# Patient Record
Sex: Female | Born: 1985 | Race: White | Hispanic: No | Marital: Single | Smoking: Current every day smoker
Health system: Southern US, Community
[De-identification: ages and names within clinical notes are randomized; demographics above are authoritative.]

## PROBLEM LIST (undated history)

## (undated) DIAGNOSIS — M009 Pyogenic arthritis, unspecified: Secondary | ICD-10-CM

## (undated) HISTORY — PX: THYROGLOSSAL DUCT CYST: SHX297

---

## 2009-12-30 ENCOUNTER — Emergency Department: Payer: Self-pay | Admitting: Emergency Medicine

## 2011-03-17 ENCOUNTER — Emergency Department: Payer: Self-pay | Admitting: Emergency Medicine

## 2011-04-17 ENCOUNTER — Emergency Department: Payer: Self-pay | Admitting: Emergency Medicine

## 2013-06-14 ENCOUNTER — Emergency Department: Payer: Self-pay | Admitting: Emergency Medicine

## 2013-08-04 ENCOUNTER — Emergency Department: Payer: Self-pay | Admitting: Emergency Medicine

## 2013-08-04 LAB — URINALYSIS, COMPLETE
Bacteria: NONE SEEN
Blood: NEGATIVE
Ketone: NEGATIVE
Leukocyte Esterase: NEGATIVE
Ph: 6 (ref 4.5–8.0)
RBC,UR: 2 /HPF (ref 0–5)
Squamous Epithelial: 2

## 2013-08-04 LAB — CBC
HCT: 42.4 % (ref 35.0–47.0)
HGB: 14.6 g/dL (ref 12.0–16.0)
MCHC: 34.5 g/dL (ref 32.0–36.0)
MCV: 85 fL (ref 80–100)
RBC: 5.01 10*6/uL (ref 3.80–5.20)
WBC: 10.2 10*3/uL (ref 3.6–11.0)

## 2013-08-04 LAB — BASIC METABOLIC PANEL
Calcium, Total: 9.2 mg/dL (ref 8.5–10.1)
Chloride: 104 mmol/L (ref 98–107)
Co2: 27 mmol/L (ref 21–32)
EGFR (African American): >60
EGFR (Non-African Amer.): >60
Glucose: 106 mg/dL — ABNORMAL HIGH (ref 65–99)
Osmolality: 275 (ref 275–301)
Potassium: 3.5 mmol/L (ref 3.5–5.1)
Sodium: 138 mmol/L (ref 136–145)

## 2013-08-08 LAB — WOUND CULTURE

## 2013-08-27 ENCOUNTER — Emergency Department: Payer: Self-pay | Admitting: Emergency Medicine

## 2013-08-27 LAB — COMPREHENSIVE METABOLIC PANEL
ALBUMIN: 3.8 g/dL (ref 3.4–5.0)
ANION GAP: 6 — AB (ref 7–16)
AST: 21 U/L (ref 15–37)
Alkaline Phosphatase: 58 U/L
BUN: 9 mg/dL (ref 7–18)
Bilirubin,Total: 0.6 mg/dL (ref 0.2–1.0)
CO2: 24 mmol/L (ref 21–32)
Calcium, Total: 9.1 mg/dL (ref 8.5–10.1)
Chloride: 103 mmol/L (ref 98–107)
Creatinine: 0.63 mg/dL (ref 0.60–1.30)
EGFR (African American): >60
GLUCOSE: 134 mg/dL — AB (ref 65–99)
OSMOLALITY: 267 (ref 275–301)
Potassium: 3.4 mmol/L — ABNORMAL LOW (ref 3.5–5.1)
SGPT (ALT): 19 U/L (ref 12–78)
Sodium: 133 mmol/L — ABNORMAL LOW (ref 136–145)
Total Protein: 7.3 g/dL (ref 6.4–8.2)

## 2013-08-27 LAB — URINALYSIS, COMPLETE
BACTERIA: NONE SEEN
BILIRUBIN, UR: NEGATIVE
BLOOD: NEGATIVE
GLUCOSE, UR: NEGATIVE mg/dL (ref 0–75)
Nitrite: NEGATIVE
Ph: 8 (ref 4.5–8.0)
Protein: 100
Specific Gravity: 1.027 (ref 1.003–1.030)
Squamous Epithelial: 1

## 2013-08-27 LAB — CBC WITH DIFFERENTIAL/PLATELET
BASOS ABS: 0 10*3/uL (ref 0.0–0.1)
Basophil %: 0.6 %
EOS ABS: 0 10*3/uL (ref 0.0–0.7)
EOS PCT: 0.1 %
HCT: 42.3 % (ref 35.0–47.0)
HGB: 14.5 g/dL (ref 12.0–16.0)
LYMPHS ABS: 0.9 10*3/uL — AB (ref 1.0–3.6)
Lymphocyte %: 15.6 %
MCH: 29.2 pg (ref 26.0–34.0)
MCHC: 34.3 g/dL (ref 32.0–36.0)
MCV: 85 fL (ref 80–100)
Monocyte #: 0.2 x10 3/mm (ref 0.2–0.9)
Monocyte %: 3.6 %
NEUTROS PCT: 80.1 %
Neutrophil #: 4.7 10*3/uL (ref 1.4–6.5)
PLATELETS: 145 10*3/uL — AB (ref 150–440)
RBC: 4.96 10*6/uL (ref 3.80–5.20)
RDW: 13.5 % (ref 11.5–14.5)
WBC: 5.8 10*3/uL (ref 3.6–11.0)

## 2013-08-27 LAB — LIPASE, BLOOD: Lipase: 93 U/L (ref 73–393)

## 2013-08-29 ENCOUNTER — Emergency Department: Payer: Self-pay | Admitting: Emergency Medicine

## 2013-08-29 LAB — URINALYSIS, COMPLETE
BILIRUBIN, UR: NEGATIVE
Bacteria: NONE SEEN
Glucose,UR: NEGATIVE mg/dL (ref 0–75)
Ketone: NEGATIVE
Nitrite: NEGATIVE
PH: 6 (ref 4.5–8.0)
Protein: NEGATIVE
RBC,UR: 3 /HPF (ref 0–5)
Specific Gravity: 1.027 (ref 1.003–1.030)
Squamous Epithelial: 2
WBC UR: 4 /HPF (ref 0–5)

## 2013-08-29 LAB — COMPREHENSIVE METABOLIC PANEL
Albumin: 4.2 g/dL (ref 3.4–5.0)
Alkaline Phosphatase: 59 U/L
Anion Gap: 2 — ABNORMAL LOW (ref 7–16)
BILIRUBIN TOTAL: 0.6 mg/dL (ref 0.2–1.0)
BUN: 12 mg/dL (ref 7–18)
CALCIUM: 9.2 mg/dL (ref 8.5–10.1)
CHLORIDE: 107 mmol/L (ref 98–107)
CREATININE: 0.77 mg/dL (ref 0.60–1.30)
Co2: 29 mmol/L (ref 21–32)
Glucose: 98 mg/dL (ref 65–99)
Osmolality: 275 (ref 275–301)
Potassium: 3.7 mmol/L (ref 3.5–5.1)
SGOT(AST): 11 U/L — ABNORMAL LOW (ref 15–37)
SGPT (ALT): 21 U/L (ref 12–78)
Sodium: 138 mmol/L (ref 136–145)
TOTAL PROTEIN: 7.8 g/dL (ref 6.4–8.2)

## 2013-08-29 LAB — CBC WITH DIFFERENTIAL/PLATELET
Basophil #: 0 10*3/uL (ref 0.0–0.1)
Basophil %: 0.8 %
Eosinophil #: 0 10*3/uL (ref 0.0–0.7)
Eosinophil %: 0.6 %
HCT: 43.7 % (ref 35.0–47.0)
HGB: 14.9 g/dL (ref 12.0–16.0)
LYMPHS ABS: 1.3 10*3/uL (ref 1.0–3.6)
Lymphocyte %: 28.3 %
MCH: 29.3 pg (ref 26.0–34.0)
MCHC: 34.1 g/dL (ref 32.0–36.0)
MCV: 86 fL (ref 80–100)
MONO ABS: 0.2 x10 3/mm (ref 0.2–0.9)
Monocyte %: 5.1 %
Neutrophil #: 3.1 10*3/uL (ref 1.4–6.5)
Neutrophil %: 65.2 %
Platelet: 131 10*3/uL — ABNORMAL LOW (ref 150–440)
RBC: 5.08 10*6/uL (ref 3.80–5.20)
RDW: 13.7 % (ref 11.5–14.5)
WBC: 4.8 10*3/uL (ref 3.6–11.0)

## 2013-08-29 LAB — LIPASE, BLOOD: LIPASE: 110 U/L (ref 73–393)

## 2014-07-13 ENCOUNTER — Emergency Department: Payer: Self-pay | Admitting: Emergency Medicine

## 2014-08-14 IMAGING — CT CT ABD-PELV W/ CM
2 of 4 series · 17 of 46 positions shown, 19 images · IV contrast (isovue)
Comparison: CT of the abdomen and pelvis performed 03/17/2011

CLINICAL DATA: Generalized abdominal pain and vomiting.

EXAM:
CT ABDOMEN AND PELVIS WITH CONTRAST
TECHNIQUE: Multidetector CT imaging of the abdomen and pelvis was performed
using the standard protocol following bolus administration of
intravenous contrast.
CONTRAST:  100 mL of Isovue 370 IV contrast

[Series 2: routine abd pel with · axial · 0.72mm/px · z∈[-483,-53]mm · 14 of 94 slices shown, 16 images]
[im 4/94  soft-tissue]
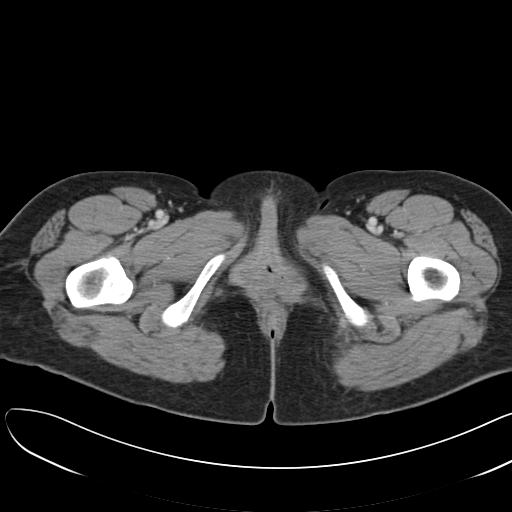
[im 4/94  bone]
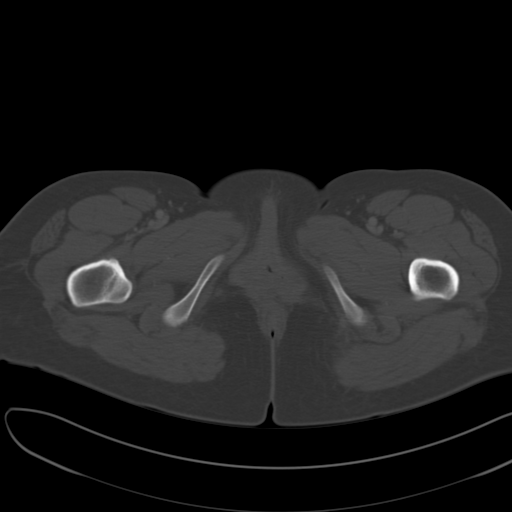
[im 12/94  soft-tissue]
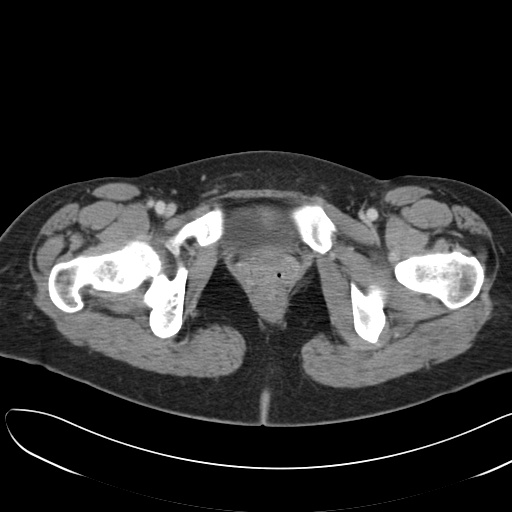
[im 19/94  soft-tissue]
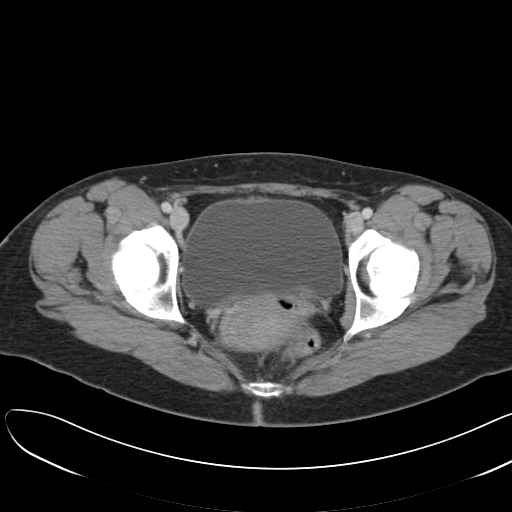
[im 27/94  soft-tissue]
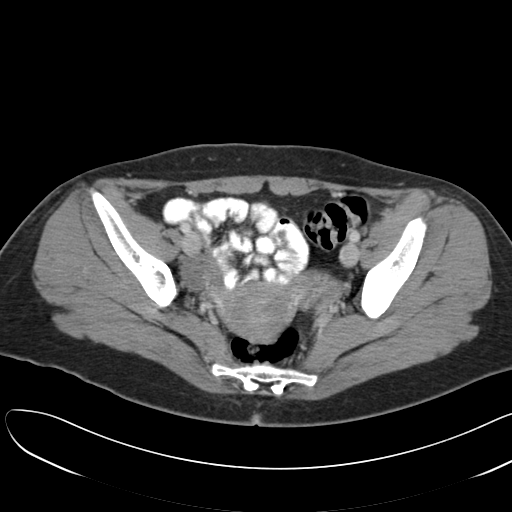
[im 30/94  soft-tissue]
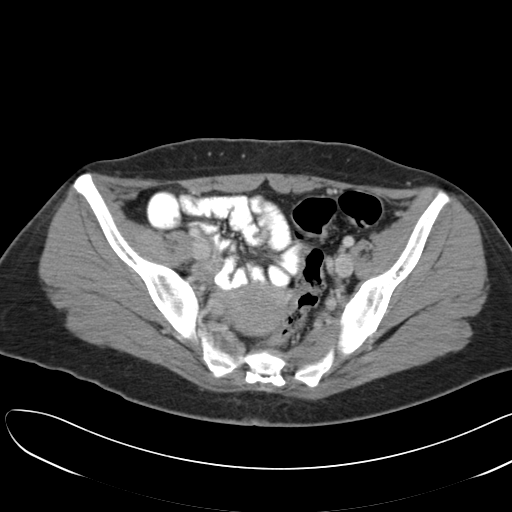
[im 38/94  soft-tissue]
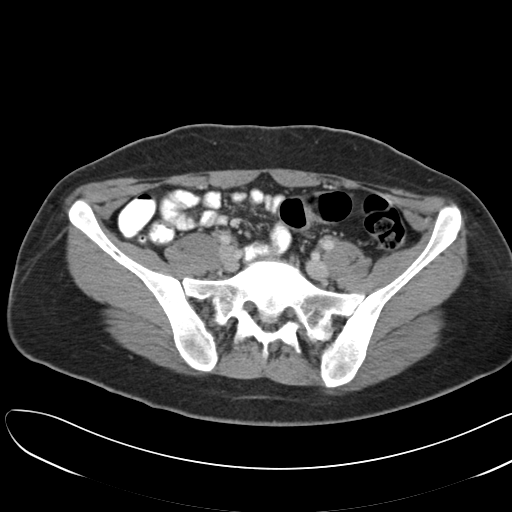
[im 45/94  soft-tissue]
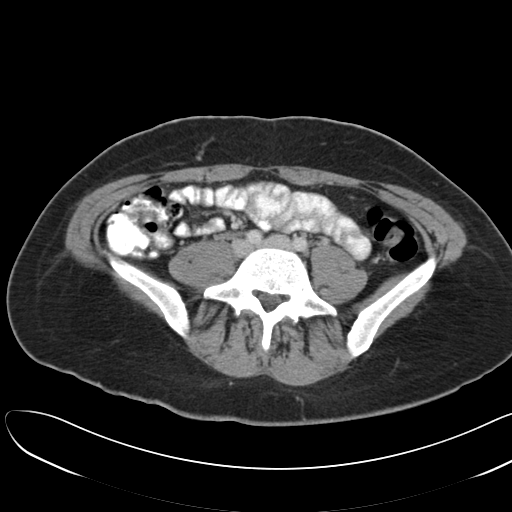
[im 49/94  soft-tissue]
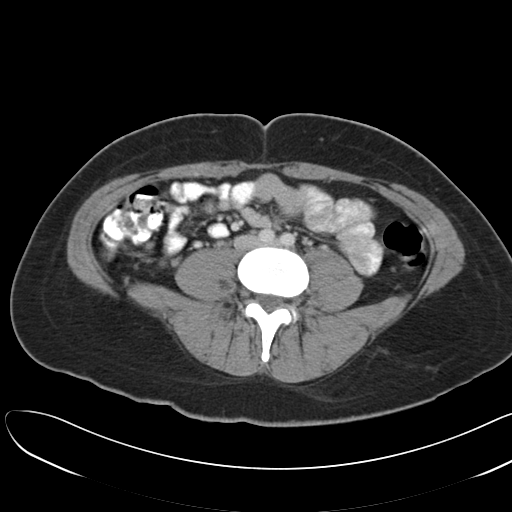
[im 56/94  soft-tissue]
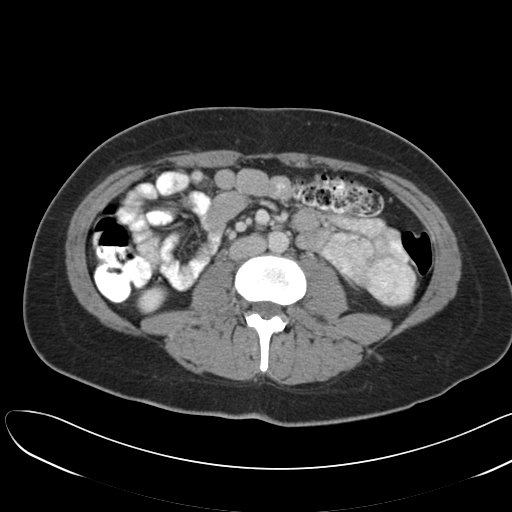
[im 56/94  bone]
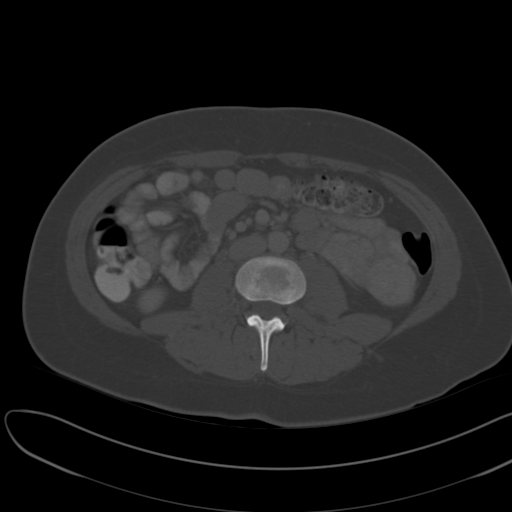
[im 64/94  soft-tissue]
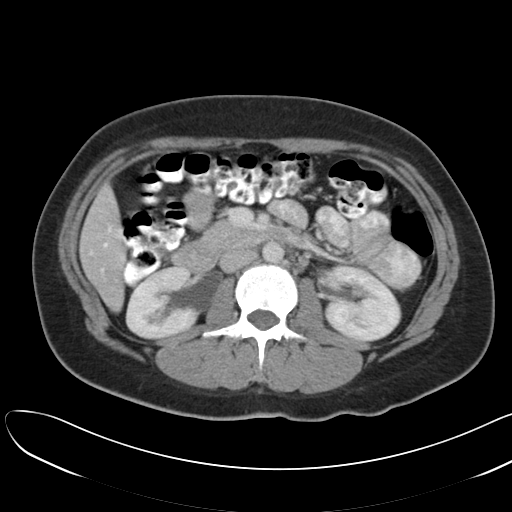
[im 71/94  soft-tissue]
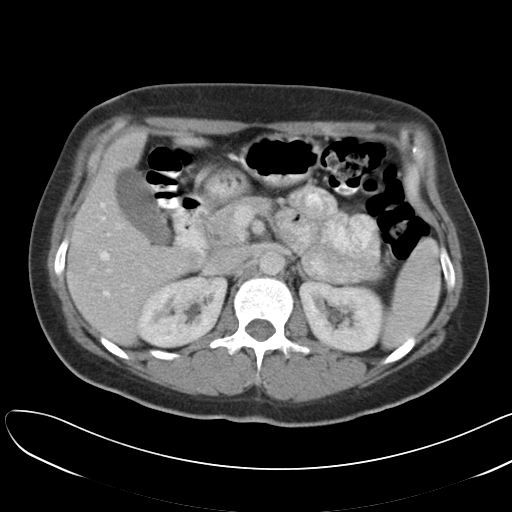
[im 75/94  soft-tissue]
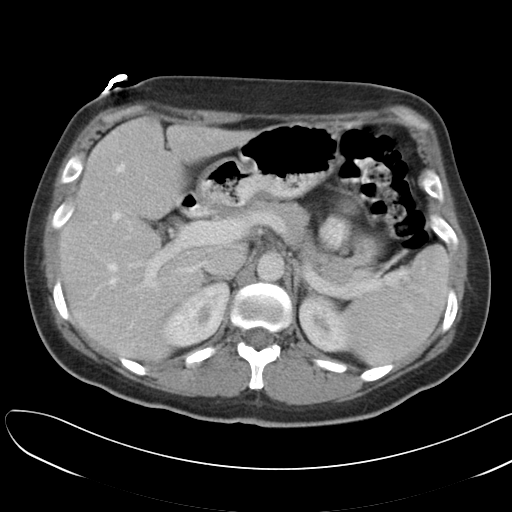
[im 82/94  soft-tissue]
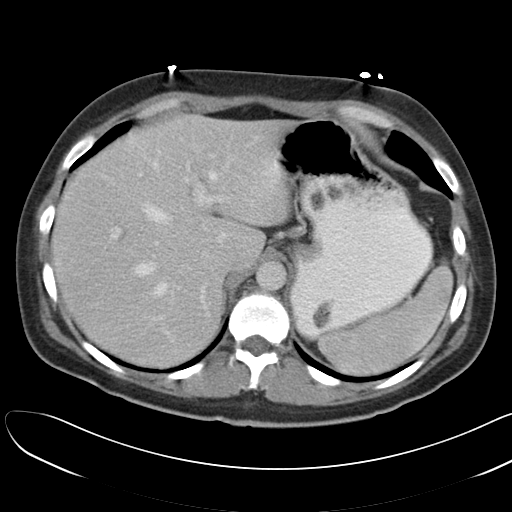
[im 90/94  soft-tissue]
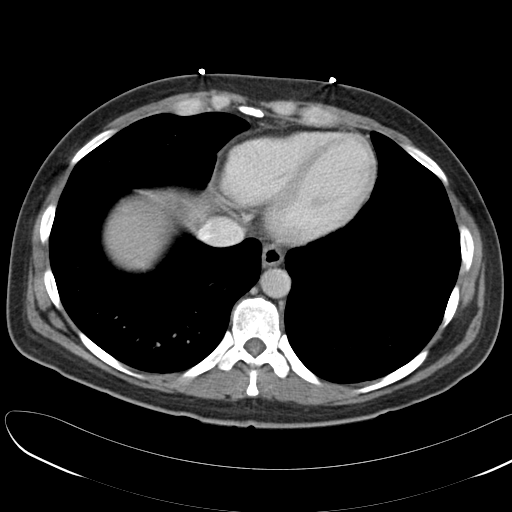

[Series 5: cor routine abd pel with · coronal · 0.94mm/px · 3 of 117 slices shown]
[im 39/117  soft-tissue]
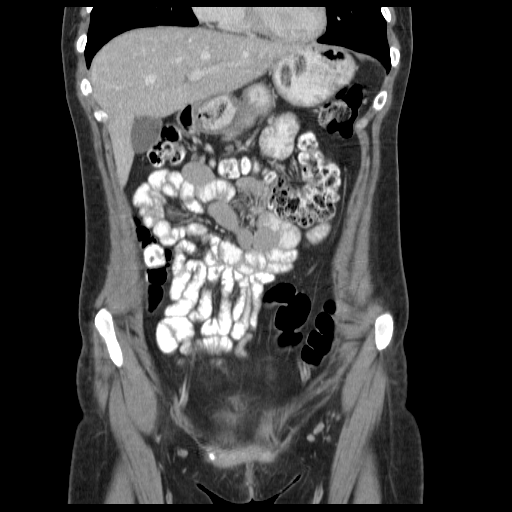
[im 52/117  soft-tissue]
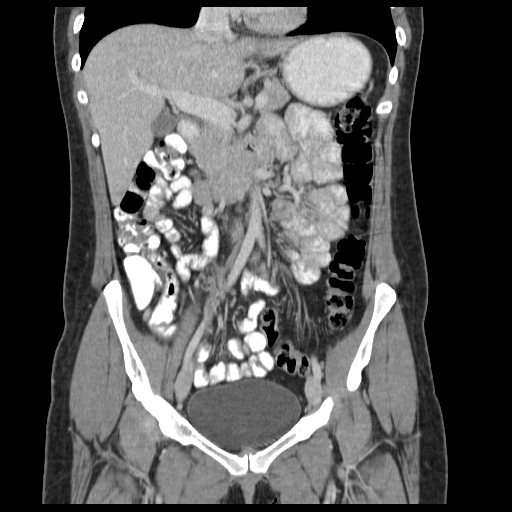
[im 65/117  soft-tissue]
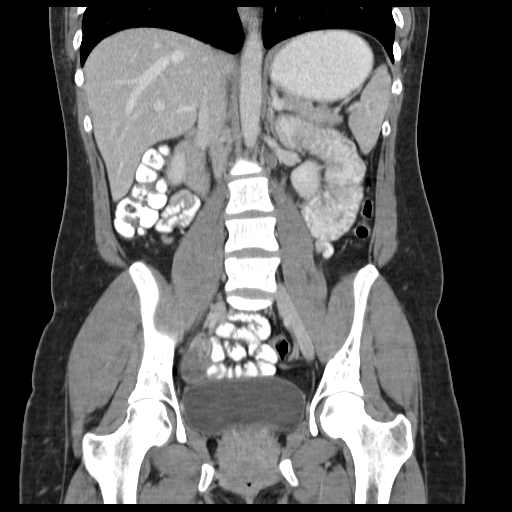

[17 of 46 positions shown; findings below may reference images not displayed]

FINDINGS: The visualized lung bases are clear.

The liver and spleen are unremarkable in appearance. The gallbladder
is within normal limits. The pancreas and adrenal glands are
unremarkable.

The kidneys are unremarkable in appearance. There is no evidence of
hydronephrosis. No renal or ureteral stones are seen. No perinephric
stranding is appreciated.

No free fluid is identified. The small bowel is unremarkable in
appearance. The stomach is largely filled with contrast and is
within normal limits. No acute vascular abnormalities are seen.

The appendix is normal in caliber and contains contrast and air,
without evidence for appendicitis. Contrast progresses to the level
of the splenic flexure of the colon. The colon is unremarkable in
appearance.

The bladder is mildly distended and grossly unremarkable. The uterus
is within normal limits. The ovaries are relatively symmetric. No
suspicious adnexal masses are seen. No inguinal lymphadenopathy is
seen.

No acute osseous abnormalities are identified.
IMPRESSION: No acute abnormality seen within the abdomen or pelvis.

## 2015-03-19 ENCOUNTER — Encounter: Payer: Self-pay | Admitting: Emergency Medicine

## 2015-03-19 ENCOUNTER — Emergency Department
Admission: EM | Admit: 2015-03-19 | Discharge: 2015-03-19 | Discharge status: Against Medical Advice | Payer: Medicaid Other | Attending: Emergency Medicine | Admitting: Emergency Medicine

## 2015-03-19 DIAGNOSIS — R51 Headache: Secondary | ICD-10-CM | POA: Insufficient documentation

## 2015-03-19 DIAGNOSIS — K088 Other specified disorders of teeth and supporting structures: Secondary | ICD-10-CM | POA: Insufficient documentation

## 2015-03-19 DIAGNOSIS — Z720 Tobacco use: Secondary | ICD-10-CM | POA: Diagnosis not present

## 2015-03-19 HISTORY — DX: Pyogenic arthritis, unspecified: M00.9

## 2015-03-19 NOTE — ED Notes (Signed)
Pt presents to ED with right sided mouth / tooth pain for the past several days. Pt states her pain worsened tonight and has now radiated to her jaw, ear, and up into her head. Denies vomiting.

## 2015-09-07 ENCOUNTER — Emergency Department
Admission: EM | Admit: 2015-09-07 | Discharge: 2015-09-07 | Disposition: A | Discharge status: Home / Self Care | Payer: Medicaid Other | Attending: Emergency Medicine | Admitting: Emergency Medicine

## 2015-09-07 DIAGNOSIS — Y9289 Other specified places as the place of occurrence of the external cause: Secondary | ICD-10-CM | POA: Diagnosis not present

## 2015-09-07 DIAGNOSIS — T402X1A Poisoning by other opioids, accidental (unintentional), initial encounter: Secondary | ICD-10-CM | POA: Diagnosis not present

## 2015-09-07 DIAGNOSIS — F17210 Nicotine dependence, cigarettes, uncomplicated: Secondary | ICD-10-CM | POA: Diagnosis not present

## 2015-09-07 DIAGNOSIS — Y9389 Activity, other specified: Secondary | ICD-10-CM | POA: Diagnosis not present

## 2015-09-07 DIAGNOSIS — T40601A Poisoning by unspecified narcotics, accidental (unintentional), initial encounter: Secondary | ICD-10-CM

## 2015-09-07 DIAGNOSIS — Y998 Other external cause status: Secondary | ICD-10-CM | POA: Insufficient documentation

## 2015-09-07 LAB — CBC
HEMATOCRIT: 32.4 % — AB (ref 35.0–47.0)
HEMOGLOBIN: 10.7 g/dL — AB (ref 12.0–16.0)
MCH: 29.2 pg (ref 26.0–34.0)
MCHC: 33.1 g/dL (ref 32.0–36.0)
MCV: 88.3 fL (ref 80.0–100.0)
Platelets: 120 10*3/uL — ABNORMAL LOW (ref 150–440)
RBC: 3.68 MIL/uL — AB (ref 3.80–5.20)
RDW: 13.2 % (ref 11.5–14.5)
WBC: 5.1 10*3/uL (ref 3.6–11.0)

## 2015-09-07 LAB — COMPREHENSIVE METABOLIC PANEL
ALBUMIN: 4.4 g/dL (ref 3.5–5.0)
ALT: 7 U/L — ABNORMAL LOW (ref 14–54)
ANION GAP: 8 (ref 5–15)
AST: 15 U/L (ref 15–41)
Alkaline Phosphatase: 51 U/L (ref 38–126)
BUN: 16 mg/dL (ref 6–20)
CHLORIDE: 105 mmol/L (ref 101–111)
CO2: 24 mmol/L (ref 22–32)
Calcium: 8.8 mg/dL — ABNORMAL LOW (ref 8.9–10.3)
Creatinine, Ser: 0.88 mg/dL (ref 0.44–1.00)
GFR calc non Af Amer: >60 mL/min (ref >60)
GLUCOSE: 91 mg/dL (ref 65–99)
Potassium: 3.5 mmol/L (ref 3.5–5.1)
SODIUM: 137 mmol/L (ref 135–145)
Total Bilirubin: 0.6 mg/dL (ref 0.3–1.2)
Total Protein: 7.6 g/dL (ref 6.5–8.1)

## 2015-09-07 LAB — ACETAMINOPHEN LEVEL

## 2015-09-07 LAB — SALICYLATE LEVEL

## 2015-09-07 LAB — ETHANOL: Alcohol, Ethyl (B): <5 mg/dL (ref <5)
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
## 2015-09-07 NOTE — Discharge Instructions (Signed)
Narcotic Overdose °A narcotic overdose is the misuse or overuse of a narcotic drug. A narcotic overdose can make you pass out and stop breathing. If you are not treated right away, this can cause permanent brain damage or stop your heart. Medicine may be given to reverse the effects of an overdose. If so, this medicine may bring on withdrawal symptoms. The symptoms may be abdominal cramps, throwing up (vomiting), sweating, chills, and nervousness. °Injecting narcotics can cause more problems than just an overdose. AIDS, hepatitis, and other very serious infections are transmitted by sharing needles and syringes. If you decide to quit using, there are medicines which can help you through the withdrawal period. Trying to quit all at once on your own can be uncomfortable, but not life-threatening. Call your caregiver, Narcotics Anonymous, or any drug and alcohol treatment program for further help.  °  °This information is not intended to replace advice given to you by your health care provider. Make sure you discuss any questions you have with your health care provider. °  °Document Released: 08/31/2004 Document Revised: 08/14/2014 Document Reviewed: 01/13/2015 °Elsevier Interactive Patient Education ©2016 [employer]. ° °

## 2015-09-07 NOTE — ED Notes (Signed)
Pt sitting up in wheelchair, in nad.  resp even and unlabored.

## 2015-09-07 NOTE — ED Notes (Signed)
Patient discharged to home per MD order. Patient in stable condition, and deemed medically cleared by ED provider for discharge. Discharge instructions reviewed with patient/family using "Teach Back"; verbalized understanding of medication education and administration, and information about follow-up care. Denies further concerns. ° °
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
## 2015-09-07 NOTE — ED Notes (Signed)
Unable to void at this time.

## 2017-12-12 ENCOUNTER — Emergency Department
Admission: EM | Admit: 2017-12-12 | Discharge: 2017-12-12 | Disposition: A | Discharge status: Home / Self Care | Payer: Self-pay | Attending: Emergency Medicine | Admitting: Emergency Medicine

## 2017-12-12 ENCOUNTER — Encounter: Payer: Self-pay | Admitting: Emergency Medicine

## 2017-12-12 ENCOUNTER — Emergency Department: Payer: Self-pay

## 2017-12-12 DIAGNOSIS — F17210 Nicotine dependence, cigarettes, uncomplicated: Secondary | ICD-10-CM | POA: Insufficient documentation

## 2017-12-12 DIAGNOSIS — L03211 Cellulitis of face: Secondary | ICD-10-CM | POA: Insufficient documentation

## 2017-12-12 DIAGNOSIS — R400 Somnolence: Secondary | ICD-10-CM | POA: Insufficient documentation

## 2017-12-12 DIAGNOSIS — R600 Localized edema: Secondary | ICD-10-CM | POA: Insufficient documentation

## 2017-12-12 DIAGNOSIS — F1190 Opioid use, unspecified, uncomplicated: Secondary | ICD-10-CM | POA: Insufficient documentation

## 2017-12-12 DIAGNOSIS — F1590 Other stimulant use, unspecified, uncomplicated: Secondary | ICD-10-CM | POA: Insufficient documentation

## 2017-12-12 DIAGNOSIS — L299 Pruritus, unspecified: Secondary | ICD-10-CM | POA: Insufficient documentation

## 2017-12-12 LAB — COMPREHENSIVE METABOLIC PANEL
ALT: 12 U/L — ABNORMAL LOW (ref 14–54)
AST: 18 U/L (ref 15–41)
Albumin: 3.1 g/dL — ABNORMAL LOW (ref 3.5–5.0)
Alkaline Phosphatase: 52 U/L (ref 38–126)
Anion gap: 5 (ref 5–15)
BILIRUBIN TOTAL: 0.3 mg/dL (ref 0.3–1.2)
BUN: 11 mg/dL (ref 6–20)
CO2: 27 mmol/L (ref 22–32)
CREATININE: 0.65 mg/dL (ref 0.44–1.00)
Calcium: 8.3 mg/dL — ABNORMAL LOW (ref 8.9–10.3)
Chloride: 102 mmol/L (ref 101–111)
GFR calc Af Amer: >60 mL/min (ref >60)
Glucose, Bld: 190 mg/dL — ABNORMAL HIGH (ref 65–99)
POTASSIUM: 3.6 mmol/L (ref 3.5–5.1)
Sodium: 134 mmol/L — ABNORMAL LOW (ref 135–145)
TOTAL PROTEIN: 6.5 g/dL (ref 6.5–8.1)

## 2017-12-12 LAB — CBC WITH DIFFERENTIAL/PLATELET
BASOS ABS: 0 10*3/uL (ref 0–0.1)
Basophils Relative: 1 %
EOS ABS: 0.1 10*3/uL (ref 0–0.7)
EOS PCT: 1 %
HCT: 33.9 % — ABNORMAL LOW (ref 35.0–47.0)
Hemoglobin: 11.3 g/dL — ABNORMAL LOW (ref 12.0–16.0)
Lymphocytes Relative: 22 %
Lymphs Abs: 0.8 10*3/uL — ABNORMAL LOW (ref 1.0–3.6)
MCH: 28.1 pg (ref 26.0–34.0)
MCHC: 33.3 g/dL (ref 32.0–36.0)
MCV: 84.3 fL (ref 80.0–100.0)
Monocytes Absolute: 0.2 10*3/uL (ref 0.2–0.9)
Monocytes Relative: 5 %
Neutro Abs: 2.7 10*3/uL (ref 1.4–6.5)
Neutrophils Relative %: 71 %
PLATELETS: 164 10*3/uL (ref 150–440)
RBC: 4.02 MIL/uL (ref 3.80–5.20)
RDW: 14.1 % (ref 11.5–14.5)
WBC: 3.8 10*3/uL (ref 3.6–11.0)

## 2017-12-12 LAB — URINE DRUG SCREEN, QUALITATIVE (ARMC ONLY)
Amphetamines, Ur Screen: POSITIVE — AB
BARBITURATES, UR SCREEN: NOT DETECTED
Benzodiazepine, Ur Scrn: NOT DETECTED
COCAINE METABOLITE, UR ~~LOC~~: NOT DETECTED
Cannabinoid 50 Ng, Ur ~~LOC~~: NOT DETECTED
MDMA (Ecstasy)Ur Screen: NOT DETECTED
Methadone Scn, Ur: NOT DETECTED
OPIATE, UR SCREEN: POSITIVE — AB
PHENCYCLIDINE (PCP) UR S: NOT DETECTED
Tricyclic, Ur Screen: NOT DETECTED

## 2017-12-12 LAB — SEDIMENTATION RATE: Sed Rate: 50 mm/hr — ABNORMAL HIGH (ref 0–20)

## 2017-12-12 LAB — C-REACTIVE PROTEIN: CRP: 1.1 mg/dL — AB (ref <1.0)

## 2017-12-12 LAB — LACTIC ACID, PLASMA: LACTIC ACID, VENOUS: 2 mmol/L — AB (ref 0.5–1.9)

## 2017-12-12 LAB — POCT PREGNANCY, URINE: PREG TEST UR: NEGATIVE

## 2017-12-12 MED ORDER — SODIUM CHLORIDE 0.9 % IV BOLUS
1000.0000 mL | Freq: Once | INTRAVENOUS | Status: AC
  Administered 2017-12-12: 1000 mL via INTRAVENOUS

## 2017-12-12 MED ORDER — CLINDAMYCIN HCL 300 MG PO CAPS: 300.0000 mg | ORAL_CAPSULE | Freq: Four times a day (QID) | ORAL | 0 refills | Status: AC

## 2017-12-12 MED ORDER — IOPAMIDOL (ISOVUE-370) INJECTION 76%
60.0000 mL | Freq: Once | INTRAVENOUS | Status: AC | PRN
  Administered 2017-12-12: 60 mL via INTRAVENOUS
  Filled 2017-12-12: qty 75

## 2017-12-12 MED ORDER — CLINDAMYCIN PHOSPHATE 900 MG/50ML IV SOLN
900.0000 mg | Freq: Once | INTRAVENOUS | Status: AC
  Administered 2017-12-12: 900 mg via INTRAVENOUS
  Filled 2017-12-12: qty 50

## 2017-12-12 NOTE — ED Provider Notes (Signed)
[facility] Emergency Department Provider Note  ____________________________________________  Time seen: Approximately 3:08 PM  I have reviewed the triage vital signs and the nursing notes.   HISTORY  Chief Complaint Insect Bite    HPI Erin Freeman is a 32 y.o. female who presents the emergency department complaining of a erythematous and edematous as well as painful lesion to the left temporal region with swelling extending into the left periorbital region.  Patient reports that 4 days ago, she and her boyfriend went camping.  They went to sleep with no visible lesions or pain.  He woke up in the morning with a small erythematous "bug bite looking" lesion.  Patient had some pruritus mixed with some mild pain at that time.  Patient tried to "pop" the bump with her fingers.  Patient reports that she has had increasing erythema, edema, pain to the region.  She reports that she is now having edema and erythema extending into the periorbital region with difficulty now opening her eye.  Patient does report pain in the posterior orbital region.  Patient endorses some visual changes, though she is unsure whether this is visual changes or difficulty seeing through edematous eyelids.  No glasses or contacts.  Patient denies any headache, fevers or chills, nausea or vomiting, diarrhea or constipation.  During interview with the patient, the patient was extremely drowsy, pinpoint pupils, sluggish in nature.  Patient had a friend in the room with similar symptoms.  Patient appears to be under the influence of illicit substances, but appears capable of making her own decisions.  Past Medical History:  Diagnosis Date  . Septic hip (HCC)     There are no active problems to display for this patient.   Past Surgical History:  Procedure Laterality Date  . THYROGLOSSAL DUCT CYST      Prior to Admission medications   Medication Sig Start Date End Date Taking? Authorizing  Provider  clindamycin (CLEOCIN) 300 MG capsule Take 1 capsule (300 mg total) by mouth 4 (four) times daily. 12/12/17   Lakeya Mulka, Delorise Royals, PA-C    Allergies Zofran [ondansetron]  No family history on file.  Social History Social History   Tobacco Use  . Smoking status: Current Every Day Smoker    Packs/day: 0.50    Types: Cigarettes  . Smokeless tobacco: Never Used  Substance Use Topics  . Alcohol use: No  . Drug use: No     Review of Systems  Constitutional: No fever/chills Eyes: Positive visual changes. No discharge.  Periorbital edema and erythema. ENT: No upper respiratory complaints. Cardiovascular: no chest pain. Respiratory: no cough. No SOB. Gastrointestinal: No abdominal pain.  No nausea, no vomiting.  No diarrhea.  No constipation. Genitourinary: Negative for dysuria. No hematuria Musculoskeletal: Negative for musculoskeletal pain. Skin: Negative for rash, abrasions, lacerations, ecchymosis.  Positive for erythematous and edematous lesion to the left temporal region. Neurological: Negative for headaches, focal weakness or numbness. 10-point ROS otherwise negative.  ____________________________________________   PHYSICAL EXAM:  VITAL SIGNS: ED Triage Vitals [12/12/17 1431]  Enc Vitals Group     BP 101/67     Pulse Rate 90     Resp 16     Temp 98 F (36.7 C)     Temp Source Oral     SpO2 97 %     Weight 150 lb (68 kg)     Height  (1.702 m)     Head Circumference      Peak Flow  Pain Score 10     Pain Loc      Pain Edu?      Excl. in GC?      Constitutional: Alert and oriented. Well appearing and in no acute distress.  Patient drowsy, pinpoint pupils, sluggish.  Patient appears underneath the influence of illicit substances. Eyes: Conjunctivae are normal.  Pupils are pinpoint.  Pupils equal and round.  Pupils do not enlarge significantly even with darkened conditions.. EOMI. Head: Grossly erythematous and edematous lesion noted to the  left temporal region.  This measures approximately 3 cm in diameter.  Central excoriation with no bleeding or purulent drainage.  There is erythema and edema extending from the temporal region to the left periorbital region.  Diffuse tenderness to palpation from significant lesion through the periorbital region.  There is no fluctuance or induration with palpation in this region.  No palpable cords.  No other appreciable abnormality to the skull or face. ENT:      Ears: EAC and TM unremarkable bilaterally.      Nose: No congestion/rhinnorhea.      Mouth/Throat: Mucous membranes are moist.  Oropharynx is nonerythematous and nonedematous. Neck: No stridor.  Neck is supple full range of motion Hematological/Lymphatic/Immunilogical: No cervical lymphadenopathy. Cardiovascular: Normal rate, regular rhythm. Normal S1 and S2.  Good peripheral circulation. Respiratory: Normal respiratory effort without tachypnea or retractions. Lungs CTAB. Good air entry to the bases with no decreased or absent breath sounds. Musculoskeletal: Full range of motion to all extremities. No gross deformities appreciated. Neurologic:  Normal speech and language. No gross focal neurologic deficits are appreciated.  Skin:  Skin is warm, dry and intact. No rash noted.  See above note for skin findings in the cranial region. Psychiatric: Mood and affect are normal. Speech and behavior are normal. Patient exhibits appropriate insight and judgement.   ____________________________________________   LABS (all labs ordered are listed, but only abnormal results are displayed)  Labs Reviewed  URINE DRUG SCREEN, QUALITATIVE ([facility] ONLY) - Abnormal; Notable for the following components:      Result Value   Amphetamines, Ur Screen POSITIVE (*)    Opiate, Ur Screen POSITIVE (*)    All other components within normal limits  COMPREHENSIVE METABOLIC PANEL - Abnormal; Notable for the following components:   Sodium 134 (*)    Glucose, Bld  190 (*)    Calcium 8.3 (*)    Albumin 3.1 (*)    ALT 12 (*)    All other components within normal limits  CBC WITH DIFFERENTIAL/PLATELET - Abnormal; Notable for the following components:   Hemoglobin 11.3 (*)    HCT 33.9 (*)    Lymphs Abs 0.8 (*)    All other components within normal limits  LACTIC ACID, PLASMA - Abnormal; Notable for the following components:   Lactic Acid, Venous 2.0 (*)    All other components within normal limits  SEDIMENTATION RATE - Abnormal; Notable for the following components:   Sed Rate 50 (*)    All other components within normal limits  CULTURE, BLOOD (ROUTINE X 2)  CULTURE, BLOOD (ROUTINE X 2)  LACTIC ACID, PLASMA  C-REACTIVE PROTEIN  POC URINE PREG, ED  POCT PREGNANCY, URINE   ____________________________________________  EKG   ____________________________________________  RADIOLOGY Festus Barren Gari Hartsell, personally viewed and evaluated these images (plain radiographs) as part of my medical decision making, as well as reviewing the written report by the radiologist.  I concur with radiologist of no appreciable abscess process or deep  space infection.  Significant cellulitis to the left facial region.  Ct Maxillofacial W Contrast  Result Date: 12/12/2017 CLINICAL DATA:  32 y/o  F; insect bite to the left side of head. EXAM: CT MAXILLOFACIAL WITH CONTRAST TECHNIQUE: Multidetector CT imaging of the maxillofacial structures was performed with intravenous contrast. Multiplanar CT image reconstructions were also generated. CONTRAST:  60mL ISOVUE-370 IOPAMIDOL (ISOVUE-370) INJECTION 76% COMPARISON:  None. FINDINGS: Osseous: No fracture or mandibular dislocation. No destructive process. Orbits: Negative. No traumatic or inflammatory finding. Sinuses: Large left and small right maxillary sinus mucous retention cyst. Soft tissues: Extensive swelling of the superficial soft tissues of the left temporal fossa, left periorbital space, and left face. No rim  enhancing fluid collection or extension of inflammation into the deep facial or upper neck compartments. Mild enlargement of left upper cervical lymph nodes, likely reactive, no lymph node necrosis. Limited intracranial: No significant or unexpected finding. IMPRESSION: Extensive swelling of superficial soft tissues of the left temporal fossa, left periorbital space, and left face compatible with cellulitis. No abscess. No extension of inflammation into the left orbital, deep facial, or deep upper neck compartments. Electronically Signed   By: Mitzi Hansen M.D.   On: 12/12/2017 18:19    ____________________________________________    PROCEDURES  Procedure(s) performed:    Procedures    Medications  sodium chloride 0.9 % bolus 1,000 mL (0 mLs Intravenous Stopped 12/12/17 1840)  clindamycin (CLEOCIN) IVPB 900 mg (0 mg Intravenous Stopped 12/12/17 1816)  iopamidol (ISOVUE-370) 76 % injection 60 mL (60 mLs Intravenous Contrast Given 12/12/17 1739)     ____________________________________________   INITIAL IMPRESSION / ASSESSMENT AND PLAN / ED COURSE  Pertinent labs & imaging results that were available during my care of the patient were reviewed by me and considered in my medical decision making (see chart for details).  Review of the [place] CSRS was performed in accordance of the [facility] prior to dispensing any controlled drugs.  Clinical Course as of Dec 12 1841  Wed Dec 12, 2017  1611 Patient with positive for lactic acid of 2.0.  White blood cell count 3.8.  Further work-up still in process.  Lactic acid, plasma(!!) [JC]    Clinical Course User Index [JC] Tersea Aulds, Delorise Royals, PA-C    Patient's diagnosis is consistent with cellulitis to the left face.  Patient presented with several day history of increasing erythema, edema and skin lesion to left face.  On exam, patient had a 3 cm skin lesion with erythema edema spreading to the left periorbital region.  Labs and imaging  were obtained.  Patient has a normal white blood cell count and does have a lactic of 2.0.  Patient received IV clindamycin here in the emergency department.  Imaging returned with no appreciable deep space abscess.  No superficial abscess either.  At this time, no indication for admission.  Patient was very drowsy, pinpoint pupils, sluggish during interview and physical exam.  Patient had reported this is 1 of her symptoms however I felt that it was likely due to illicit substances.  UDS reveals amphetamines and opioids neither which are prescribed to patient.  After fluids, patient's pupils were slightly less pinpoint, she was slightly less drowsy and sluggish.. Patient will be discharged home with prescriptions for oral clindamycin.  Strict return precautions should this condition worsen, fevers or chills develop, or other signs of worsening infection. Patient is to follow up with primary care as needed or otherwise directed. Patient is given ED precautions to return  to the ED for any worsening or new symptoms.     ____________________________________________  FINAL CLINICAL IMPRESSION(S) / ED DIAGNOSES  Final diagnoses:  Cellulitis of face      NEW MEDICATIONS STARTED DURING THIS VISIT:  ED Discharge Orders        Ordered    clindamycin (CLEOCIN) 300 MG capsule  4 times daily     12/12/17 1836          This chart was dictated using voice recognition software/Dragon. Despite best efforts to proofread, errors can occur which can change the meaning. Any change was purely unintentional.    Racheal Patches, PA-C 12/12/17 1843    Dionne Bucy, MD 12/12/17 2340

## 2017-12-12 NOTE — ED Triage Notes (Signed)
Pt comes into the ED via POV c/o possible insect bite to the left side of her head.  Patient has redness and swelling around the area and now swelling present in the left eye.  Patient was camping and then noticed the bump the next day.  Patient states she picked at the area and now it has gotten worse.  Patient has even and unlabored respirations at this time.

## 2017-12-12 NOTE — ED Notes (Signed)
Pt unable to void at this time. 

## 2017-12-12 NOTE — ED Notes (Signed)
poct pregnancy Negative 

## 2017-12-12 NOTE — ED Notes (Addendum)
Notified by the lab of a critical lactic level of 2.0. Notified PA Cuthriell

## 2017-12-17 LAB — CULTURE, BLOOD (ROUTINE X 2)
CULTURE: NO GROWTH
CULTURE: NO GROWTH
SPECIAL REQUESTS: ADEQUATE
